# Patient Record
Sex: Female | Born: 1995
Health system: Southern US, Community
[De-identification: ages and names within clinical notes are randomized; demographics above are authoritative.]

## PROBLEM LIST (undated history)

## (undated) DIAGNOSIS — E669 Obesity, unspecified: Secondary | ICD-10-CM

---

## 2020-02-29 ENCOUNTER — Encounter (HOSPITAL_BASED_OUTPATIENT_CLINIC_OR_DEPARTMENT_OTHER): Payer: Self-pay | Admitting: *Deleted

## 2020-02-29 ENCOUNTER — Other Ambulatory Visit: Payer: Self-pay

## 2020-02-29 ENCOUNTER — Emergency Department (HOSPITAL_BASED_OUTPATIENT_CLINIC_OR_DEPARTMENT_OTHER)
Admission: EM | Admit: 2020-02-29 | Discharge: 2020-03-01 | Disposition: A | Discharge status: Home / Self Care | Payer: Self-pay | Attending: Emergency Medicine | Admitting: Emergency Medicine

## 2020-02-29 DIAGNOSIS — N760 Acute vaginitis: Secondary | ICD-10-CM | POA: Insufficient documentation

## 2020-02-29 DIAGNOSIS — B9689 Other specified bacterial agents as the cause of diseases classified elsewhere: Secondary | ICD-10-CM

## 2020-02-29 DIAGNOSIS — R102 Pelvic and perineal pain: Secondary | ICD-10-CM

## 2020-02-29 DIAGNOSIS — Z711 Person with feared health complaint in whom no diagnosis is made: Secondary | ICD-10-CM

## 2020-02-29 DIAGNOSIS — Z79899 Other long term (current) drug therapy: Secondary | ICD-10-CM | POA: Insufficient documentation

## 2020-02-29 LAB — URINALYSIS, ROUTINE W REFLEX MICROSCOPIC
Bilirubin Urine: NEGATIVE
Glucose, UA: NEGATIVE mg/dL
Ketones, ur: NEGATIVE mg/dL
Leukocytes,Ua: NEGATIVE
Nitrite: NEGATIVE
Protein, ur: NEGATIVE mg/dL
Specific Gravity, Urine: 1.025 (ref 1.005–1.030)
pH: 6 (ref 5.0–8.0)

## 2020-02-29 LAB — WET PREP, GENITAL
Sperm: NONE SEEN
Trich, Wet Prep: NONE SEEN
Yeast Wet Prep HPF POC: NONE SEEN

## 2020-02-29 LAB — URINALYSIS, MICROSCOPIC (REFLEX)

## 2020-02-29 LAB — PREGNANCY, URINE: Preg Test, Ur: NEGATIVE

## 2020-02-29 MED ORDER — METRONIDAZOLE 500 MG PO TABS: 500.0000 mg | ORAL_TABLET | Freq: Two times a day (BID) | ORAL | 0 refills | Status: AC

## 2020-02-29 NOTE — ED Provider Notes (Signed)
[facility] Provider Note: Carrie Dell, MD, FACEP  CSN: [id number] MRN: [id number] ARRIVAL: 02/29/20 at 1810 ROOM: [facility]02/[facility]02   CHIEF COMPLAINT  Vaginal Pain   HISTORY OF PRESENT ILLNESS  02/29/20 11:10 PM Carrie Deleon is a 24 y.o. female with 1 to 2 weeks pelvic discomfort.  She describes it as pain with urination but not at rest.  Symptoms are moderate.  She is concerned she may have a urinary tract infection.  She feels like something is "just not right down there".  She started spotting today and that concerned her because she is not due to start her period for about a week and a half.  She has had associated nausea.  She is also had some vaguely described bilateral lower rib pain that is not severe.  It is not worse with palpation or breathing.  She has not noted a vaginal discharge but has noted an abnormal odor.   History reviewed. No pertinent past medical history.  History reviewed. No pertinent surgical history.  No family history on file.  Social History   Tobacco Use  . Smoking status: Not on file  Substance Use Topics  . Alcohol use: Not on file  . Drug use: Not on file    Prior to Admission medications   Medication Sig Start Date End Date Taking? Authorizing Provider  metroNIDAZOLE (FLAGYL) 500 MG tablet Take 1 tablet (500 mg total) by mouth 2 (two) times daily. One po bid x 7 days 02/29/20   Silvano Garofano, Jonny Ruiz, MD    Allergies Patient has no known allergies.   REVIEW OF SYSTEMS  Negative except as noted here or in the History of Present Illness.   PHYSICAL EXAMINATION  Initial Vital Signs Blood pressure 112/71, pulse 88, temperature 98.5 F (36.9 C), temperature source Oral, resp. rate 20, height 5' 4.5" (1.638 m), weight 131.5 kg, last menstrual period 02/29/2020, SpO2 100 %.  Examination General: Well-developed, obese female in no acute distress; appearance consistent with age of record HENT: normocephalic; atraumatic Eyes: pupils equal,  round and reactive to light; extraocular muscles intact Neck: supple Heart: regular rate and rhythm Lungs: clear to auscultation bilaterally Chest: No rib tenderness Abdomen: soft; nondistended; mild suprapubic tenderness; bowel sounds present GU: Normal external genitalia; vaginal spotting; mucoid discharge per cervical os; cervix erythematous; cervical motion tenderness and bilateral adnexal tenderness which are mild to moderate Extremities: No deformity; full range of motion; pulses normal Neurologic: Awake, alert and oriented; motor function intact in all extremities and symmetric; no facial droop Skin: Warm and dry Psychiatric: Normal mood and affect   RESULTS  Summary of this visit's results, reviewed and interpreted by myself:   EKG Interpretation  Date/Time:    Ventricular Rate:    PR Interval:    QRS Duration:   QT Interval:    QTC Calculation:   R Axis:     Text Interpretation:        Laboratory Studies: Results for orders placed or performed during the hospital encounter of 02/29/20 (from the past 24 hour(s))  Urinalysis, Routine w reflex microscopic Urine, Clean Catch     Status: Abnormal   Collection Time: 02/29/20  6:37 PM  Result Value Ref Range   Color, Urine YELLOW YELLOW   APPearance CLEAR CLEAR   Specific Gravity, Urine 1.025 1.005 - 1.030   pH 6.0 5.0 - 8.0   Glucose, UA NEGATIVE NEGATIVE mg/dL   Hgb urine dipstick TRACE (A) NEGATIVE   Bilirubin Urine NEGATIVE NEGATIVE  Ketones, ur NEGATIVE NEGATIVE mg/dL   Protein, ur NEGATIVE NEGATIVE mg/dL   Nitrite NEGATIVE NEGATIVE   Leukocytes,Ua NEGATIVE NEGATIVE  Pregnancy, urine     Status: None   Collection Time: 02/29/20  6:37 PM  Result Value Ref Range   Preg Test, Ur NEGATIVE NEGATIVE  Urinalysis, Microscopic (reflex)     Status: Abnormal   Collection Time: 02/29/20  6:37 PM  Result Value Ref Range   RBC / HPF 0-5 0 - 5 RBC/hpf   WBC, UA 0-5 0 - 5 WBC/hpf   Bacteria, UA MANY (A) NONE SEEN    Squamous Epithelial / LPF 6-10 0 - 5  Wet prep, genital     Status: Abnormal   Collection Time: 02/29/20 11:20 PM   Specimen: Cervical / Endocervical swab  Result Value Ref Range   Yeast Wet Prep HPF POC NONE SEEN NONE SEEN   Trich, Wet Prep NONE SEEN NONE SEEN   Clue Cells Wet Prep HPF POC PRESENT (A) NONE SEEN   WBC, Wet Prep HPF POC MODERATE (A) NONE SEEN   Sperm NONE SEEN    Imaging Studies: No results found.  ED COURSE and MDM  Nursing notes, initial and subsequent vitals signs, including pulse oximetry, reviewed and interpreted by myself.  Vitals:   02/29/20 1834 02/29/20 2139  BP: 127/86 112/71  Pulse: (!) 102 88  Resp: 18 20  Temp: 98.5 F (36.9 C)   TempSrc: Oral   SpO2: 99% 100%  Weight: 131.5 kg   Height: 5' 4.5" (1.638 m)    Medications - No data to display  Wet prep is consistent with bacterial vaginosis.  Her examination is concerning for cervicitis but she states that she and her fianc are mutually monogamous and would prefer to wait until STD testing results before we treat her.  She agrees to be treated for bacterial vaginosis in the meantime and would like to be set up for a pelvic ultrasound.  PROCEDURES  Procedures   ED DIAGNOSES     ICD-10-CM   1. BV (bacterial vaginosis)  N76.0    B96.89   2. Concern about STD in female without diagnosis  Z71.1   3. Pelvic pain in female  R10.2        Paula Libra, MD 02/29/20 2356

## 2020-02-29 NOTE — ED Triage Notes (Signed)
Pt c/o vaginal pain with dysuria  x 3 days

## 2020-03-01 ENCOUNTER — Ambulatory Visit (HOSPITAL_BASED_OUTPATIENT_CLINIC_OR_DEPARTMENT_OTHER)
Admission: RE | Admit: 2020-03-01 | Discharge: 2020-03-01 | Disposition: A | Discharge status: Home / Self Care | Payer: Self-pay | Source: Ambulatory Visit | Attending: Emergency Medicine | Admitting: Emergency Medicine

## 2020-03-01 DIAGNOSIS — R102 Pelvic and perineal pain: Secondary | ICD-10-CM | POA: Insufficient documentation

## 2020-03-01 NOTE — ED Notes (Signed)
Pt to have out patient [place] 8/28 at 10 am. Pt given location and prep instructions.

## 2020-03-02 LAB — URINE CULTURE

## 2020-03-03 LAB — GC/CHLAMYDIA PROBE AMP (~~LOC~~) NOT AT ARMC
Chlamydia: NEGATIVE
Comment: NEGATIVE
Comment: NORMAL
Neisseria Gonorrhea: NEGATIVE

## 2020-03-04 ENCOUNTER — Encounter (HOSPITAL_BASED_OUTPATIENT_CLINIC_OR_DEPARTMENT_OTHER): Payer: Self-pay | Admitting: Emergency Medicine

## 2020-03-04 ENCOUNTER — Other Ambulatory Visit: Payer: Self-pay

## 2020-03-04 ENCOUNTER — Emergency Department (HOSPITAL_BASED_OUTPATIENT_CLINIC_OR_DEPARTMENT_OTHER): Payer: Self-pay

## 2020-03-04 ENCOUNTER — Emergency Department (HOSPITAL_BASED_OUTPATIENT_CLINIC_OR_DEPARTMENT_OTHER)
Admission: EM | Admit: 2020-03-04 | Discharge: 2020-03-04 | Disposition: A | Discharge status: Home / Self Care | Payer: Self-pay | Attending: Emergency Medicine | Admitting: Emergency Medicine

## 2020-03-04 DIAGNOSIS — M25572 Pain in left ankle and joints of left foot: Secondary | ICD-10-CM | POA: Insufficient documentation

## 2020-03-04 DIAGNOSIS — R030 Elevated blood-pressure reading, without diagnosis of hypertension: Secondary | ICD-10-CM

## 2020-03-04 DIAGNOSIS — M545 Low back pain, unspecified: Secondary | ICD-10-CM

## 2020-03-04 DIAGNOSIS — M25561 Pain in right knee: Secondary | ICD-10-CM | POA: Insufficient documentation

## 2020-03-04 DIAGNOSIS — W19XXXA Unspecified fall, initial encounter: Secondary | ICD-10-CM

## 2020-03-04 HISTORY — DX: Obesity, unspecified: E66.9

## 2020-03-04 LAB — PREGNANCY, URINE: Preg Test, Ur: NEGATIVE

## 2020-03-04 MED ORDER — METHOCARBAMOL 500 MG PO TABS: 500.0000 mg | ORAL_TABLET | Freq: Two times a day (BID) | ORAL | 0 refills | Status: AC

## 2020-03-04 MED FILL — METHOCARBAMOL 500 MG TABS: 500 | 5 days supply | Qty: 10 | Fill #0

## 2020-03-04 NOTE — ED Notes (Signed)
Patient transported to X-ray 

## 2020-03-04 NOTE — ED Provider Notes (Signed)
[facility] EMERGENCY DEPARTMENT Provider Note   CSN: [id number] Arrival date & time: 03/04/20  0354     History Chief Complaint  Patient presents with  . Fall    Carrie Deleon is a 24 y.o. female history of obesity otherwise healthy.  Patient presents today after a fall at [facility].  She slipped on a wet floor, she reports her left foot slid out from under her.  She reports she twisted her left ankle, landing on her left ankle and right knee at the same time.  Patient reports the left ankle pain as moderate intensity constant aching worse with palpation and movement improved with rest and elevation.  Patient also reports right knee pain as moderate intensity aching nonradiating worse with movement and palpation improved with rest.  Patient reports that several minutes after her fall she developed right lower back pain, aching mild constant nonradiating worsened with movement improved with rest.  Patient denies head injury, loss conscious, headache, blood thinner use, neck pain, chest pain, abdominal pain, pelvic pain, numbness/weakness, saddle area paresthesias, tingling, bowel/bladder incontinence, urinary retention or any additional concerns.  HPI     Past Medical History:  Diagnosis Date  . Obesity     There are no problems to display for this patient.   History reviewed. No pertinent surgical history.   OB History   No obstetric history on file.     No family history on file.  Social History   Tobacco Use  . Smoking status: Not on file  Substance Use Topics  . Alcohol use: Not on file  . Drug use: Not on file    Home Medications Prior to Admission medications   Medication Sig Start Date End Date Taking? Authorizing Provider  methocarbamol (ROBAXIN) 500 MG tablet Take 1 tablet (500 mg total) by mouth 2 (two) times daily for 5 days. 03/04/20 03/09/20  Harlene Salts A, PA-C  metroNIDAZOLE (FLAGYL) 500 MG tablet Take 1 tablet (500 mg total) by mouth 2 (two)  times daily. One po bid x 7 days 02/29/20   Molpus, John, MD    Allergies    Influenza vac split quad  Review of Systems   Review of Systems Ten systems are reviewed and are negative for acute change except as noted in the HPI  Physical Exam Updated Vital Signs BP (!) 149/106 (BP Location: Right Arm)   Pulse 93   Temp 97.7 F (36.5 C) (Oral)   Resp 19   LMP 02/29/2020   SpO2 100%   Physical Exam Constitutional:      General: She is not in acute distress.    Appearance: Normal appearance. She is well-developed. She is not ill-appearing or diaphoretic.  HENT:     Head: Normocephalic and atraumatic.     Jaw: There is normal jaw occlusion.     Right Ear: External ear normal. No hemotympanum.     Left Ear: External ear normal. No hemotympanum.     Nose: Nose normal.     Mouth/Throat:     Mouth: Mucous membranes are moist.     Pharynx: Oropharynx is clear.  Eyes:     General: Vision grossly intact. Gaze aligned appropriately.     Extraocular Movements: Extraocular movements intact.     Pupils: Pupils are equal, round, and reactive to light.  Neck:     Trachea: Trachea and phonation normal. No tracheal tenderness or tracheal deviation.     Comments: No midline C/T/L spinal tenderness to palpation, no  deformity, crepitus, or step-off noted. No sign of injury to the neck or back. Pulmonary:     Effort: Pulmonary effort is normal. No respiratory distress.  Abdominal:     General: There is no distension.     Palpations: Abdomen is soft.     Tenderness: There is no abdominal tenderness. There is no guarding or rebound.  Musculoskeletal:        General: Normal range of motion.     Cervical back: Normal and normal range of motion.     Thoracic back: Normal.     Lumbar back: Tenderness present. No deformity or bony tenderness.       Back:     Right hip: No tenderness. Normal range of motion.     Left hip: No tenderness. Normal range of motion.     Right upper leg: No deformity  or tenderness.     Left upper leg: No deformity or tenderness.     Right knee: No deformity. Tenderness present over the medial joint line and lateral joint line.     Left knee: No deformity or bony tenderness. No tenderness.     Right lower leg: No deformity or tenderness.     Left lower leg: No deformity or tenderness.     Right ankle: No deformity. No tenderness.     Right Achilles Tendon: Normal.     Left ankle: No deformity. Tenderness present.     Left Achilles Tendon: Normal.     Right foot: No deformity, tenderness or bony tenderness.     Left foot: No deformity, tenderness or bony tenderness.  Skin:    General: Skin is warm and dry.  Neurological:     Mental Status: She is alert.     GCS: GCS eye subscore is 4. GCS verbal subscore is 5. GCS motor subscore is 6.     Comments: Speech is clear and goal oriented, follows commands Major Cranial nerves without deficit, no facial droop Moves extremities without ataxia, coordination intact  Psychiatric:        Behavior: Behavior normal.     ED Results / Procedures / Treatments   Labs (all labs ordered are listed, but only abnormal results are displayed) Labs Reviewed  PREGNANCY, URINE    EKG None  Radiology DG Lumbar Spine Complete  Result Date: 03/04/2020 CLINICAL DATA:  Fall.  Lumbar spine pain. EXAM: LUMBAR SPINE - COMPLETE 4+ VIEW COMPARISON:  No pertinent prior exams are available for comparison. FINDINGS: Five lumbar vertebrae are presumed for the purposes of this examination. No significant spondylolisthesis. There is no appreciable lumbar compression fracture. At L5-S1, there is mild/moderate disc space narrowing and mild facet arthrosis. IMPRESSION: No appreciable lumbar compression fracture. At L5-S, there is mild/moderate disc space narrowing and mild facet arthrosis. Electronically Signed   By: Jackey LogeKyle  Golden DO   On: 03/04/2020 11:37   DG Ankle Complete Left  Result Date: 03/04/2020 CLINICAL DATA:  Fall. EXAM:  LEFT ANKLE COMPLETE - 3+ VIEW COMPARISON:  No recent. FINDINGS: No acute bony or joint abnormality. No evidence of acute fracture or dislocation. Corticated bony density noted adjacent to the lateral malleolus consistent with old fracture fragment. IMPRESSION: Corticated bony density noted adjacent to the lateral malleolus consistent with old fracture fragment. No acute abnormality identified. Electronically Signed   By: Maisie Fushomas  Register   On: 03/04/2020 11:46   DG Knee Complete 4 Views Right  Result Date: 03/04/2020 CLINICAL DATA:  Fall. EXAM: RIGHT KNEE - COMPLETE 4+  VIEW COMPARISON:  No recent. FINDINGS: No acute bony or joint abnormality. No evidence of fracture. No evidence of dislocation. IMPRESSION: No acute abnormality. Electronically Signed   By: Maisie Fus  Register   On: 03/04/2020 11:48   DG Foot Complete Left  Result Date: 03/04/2020 CLINICAL DATA:  Fall. EXAM: LEFT FOOT - COMPLETE 3+ VIEW COMPARISON:  No prior. FINDINGS: No acute bony or joint abnormality. No evidence of fracture or dislocation. No radiopaque foreign body. IMPRESSION: No acute abnormality. Electronically Signed   By: Maisie Fus  Register   On: 03/04/2020 11:51    Procedures Procedures (including critical care time)  Medications Ordered in ED Medications - No data to display  ED Course  I have reviewed the triage vital signs and the nursing notes.  Pertinent labs & imaging results that were available during my care of the patient were reviewed by me and considered in my medical decision making (see chart for details).    MDM Rules/Calculators/A&P                         Additional history obtained from: 1. Nursing notes from this visit. ---------------------------------------------- DG Lumbar Spine:  IMPRESSION:  No appreciable lumbar compression fracture.    At L5-S, there is mild/moderate disc space narrowing and mild facet  arthrosis.   DG Right Knee:  IMPRESSION:  No acute abnormality.   DG Left  Ankle:  IMPRESSION:  Corticated bony density noted adjacent to the lateral malleolus  consistent with old fracture fragment. No acute abnormality  identified.   DG Left Foot:  IMPRESSION:  No acute abnormality.  - No evidence of significant head neck back chest or abdominal injury.  No pain of the pelvis.  Good range of motion and strength of the bilateral upper extremities without pain.  Patient with likely muscular right lower back pain without sciatica, no symptoms to suggest cauda equina or other emergent pathologies.  Will treat with Robaxin 500 mg twice daily.  We discussed muscle relaxer precautions and patient stated understanding.  Patient's patient with likely musculoskeletal right knee pain, placed in a knee brace, given crutches and referred to orthopedist for further evaluation.  Neurovascular intact distally to the right knee, no evidence of septic arthritis, compartment syndrome, DVT, cellulitis or other emergent pathologies.  Additionally no pain to the tibial plateau.  Additionally patient with left ankle pain suspect a ligamentous sprain, neurovascular intact, no evidence of cellulitis, DVT, compartment syndrome, neurovascular compromise, cellulitis or other emergent pathologies.  Patient given ankle brace.  Patient encouraged to follow-up with orthopedist for further evaluation she states understanding of limitations of x-rays today and that further evaluation may be needed.  Patient also informed on incidental findings above and states understanding.  Finally patient with elevated blood pressure reading, patient made aware and encouraged to follow-up with PCP for blood pressure recheck and medication management as indicated.  She is asymptomatic regarding elevated blood pressure reading, patient given signs/symptoms of hypertensive urgency/emergency and to return to the ER for repair.  At this time there does not appear to be any evidence of an acute emergency medical condition and  the patient appears stable for discharge with appropriate outpatient follow up. Diagnosis was discussed with patient who verbalizes understanding of care plan and is agreeable to discharge. I have discussed return precautions with patient who verbalizes understanding. Patient encouraged to follow-up with their PCP and ortho. All questions answered.  Note: Portions of this report may have been transcribed  using voice recognition software. Every effort was made to ensure accuracy; however, inadvertent computerized transcription errors may still be present. Final Clinical Impression(s) / ED Diagnoses Final diagnoses:  Fall, initial encounter  Acute right-sided low back pain without sciatica  Acute pain of right knee  Acute left ankle pain  Elevated blood pressure reading    Rx / DC Orders ED Discharge Orders         Ordered    methocarbamol (ROBAXIN) 500 MG tablet  2 times daily        03/04/20 1414           Elizabeth Palau 03/04/20 1426    Melene Plan, DO 03/04/20 1432

## 2020-03-04 NOTE — Discharge Instructions (Addendum)
At this time there does not appear to be the presence of an emergent medical condition, however there is always the potential for conditions to change. Please read and follow the below instructions.  Please return to the Emergency Department immediately for any new or worsening symptoms. Please be sure to follow up with your Primary Care Provider within one week regarding your visit today; please call their office to schedule an appointment even if you are feeling better for a follow-up visit. You may use the muscle relaxer Robaxin as prescribed to help with your symptoms.  Do not drive or operate heavy machinery while taking Robaxin as it will make you drowsy.  Do not drink alcohol or take other sedating medications while taking Robaxin as this will worsen side effects. The x-ray of your left ankle showed what appears to be an old fracture.  Your x-ray of your lumbar spine shows disc space narrowing and arthrosis.  As we discussed unseen injuries may be present such as unseen fractures or ligamentous and tendon injury among other abnormalities.  I advised that you call your primary care doctor to schedule a follow-up appointment to discuss your work-up and x-ray findings.  You have also been given referral to Dr. Aundria Rud, orthopedic specialist, call his office to schedule a follow-up appointment for further evaluation and treatment of your symptoms.  Please use rest ice and elevation to help with your symptoms. Have your blood pressure rechecked by your primary care doctor at your follow-up visit.  Get help right away if: You develop new bowel or bladder control problems. You have unusual weakness or numbness in your arms or legs. You develop nausea or vomiting. You develop abdominal pain. You feel faint. You cannot move the joint. Your fingers or toes tingle, lose feeling, or get cold and blue. You have a fever along with a joint that is red, warm, and swollen. You have any new/concerning or  worsening of symptoms   Please read the additional information packets attached to your discharge summary.  Do not take your medicine if  develop an itchy rash, swelling in your mouth or lips, or difficulty breathing; call 911 and seek immediate emergency medical attention if this occurs.  You may review your lab tests and imaging results in their entirety on your MyChart account.  Please discuss all results of fully with your primary care provider and other specialist at your follow-up visit.  Note: Portions of this text may have been transcribed using voice recognition software. Every effort was made to ensure accuracy; however, inadvertent computerized transcription errors may still be present.

## 2020-03-04 NOTE — ED Triage Notes (Signed)
Walking in [place] this am,  stepped into a puddle on floor, no LOC, left ankle, right knee and lower back

## 2020-04-21 ENCOUNTER — Other Ambulatory Visit: Payer: Self-pay

## 2020-04-21 DIAGNOSIS — Z20822 Contact with and (suspected) exposure to covid-19: Secondary | ICD-10-CM

## 2020-04-22 LAB — NOVEL CORONAVIRUS, NAA: SARS-CoV-2, NAA: NOT DETECTED

## 2020-04-22 LAB — SARS-COV-2, NAA 2 DAY TAT

## 2020-05-03 ENCOUNTER — Emergency Department (HOSPITAL_BASED_OUTPATIENT_CLINIC_OR_DEPARTMENT_OTHER): Payer: Self-pay

## 2020-05-03 ENCOUNTER — Emergency Department (HOSPITAL_BASED_OUTPATIENT_CLINIC_OR_DEPARTMENT_OTHER)
Admission: EM | Admit: 2020-05-03 | Discharge: 2020-05-03 | Disposition: A | Discharge status: Home / Self Care | Payer: Self-pay | Attending: Emergency Medicine | Admitting: Emergency Medicine

## 2020-05-03 ENCOUNTER — Encounter (HOSPITAL_BASED_OUTPATIENT_CLINIC_OR_DEPARTMENT_OTHER): Payer: Self-pay | Admitting: Emergency Medicine

## 2020-05-03 ENCOUNTER — Other Ambulatory Visit: Payer: Self-pay

## 2020-05-03 DIAGNOSIS — G43109 Migraine with aura, not intractable, without status migrainosus: Secondary | ICD-10-CM

## 2020-05-03 DIAGNOSIS — G43809 Other migraine, not intractable, without status migrainosus: Secondary | ICD-10-CM | POA: Insufficient documentation

## 2020-05-03 DIAGNOSIS — R200 Anesthesia of skin: Secondary | ICD-10-CM

## 2020-05-03 DIAGNOSIS — G43409 Hemiplegic migraine, not intractable, without status migrainosus: Secondary | ICD-10-CM

## 2020-05-03 DIAGNOSIS — R519 Headache, unspecified: Secondary | ICD-10-CM

## 2020-05-03 DIAGNOSIS — F17200 Nicotine dependence, unspecified, uncomplicated: Secondary | ICD-10-CM | POA: Insufficient documentation

## 2020-05-03 DIAGNOSIS — H539 Unspecified visual disturbance: Secondary | ICD-10-CM

## 2020-05-03 LAB — URINALYSIS, ROUTINE W REFLEX MICROSCOPIC
Bilirubin Urine: NEGATIVE
Glucose, UA: NEGATIVE mg/dL
Ketones, ur: NEGATIVE mg/dL
Nitrite: NEGATIVE
Protein, ur: NEGATIVE mg/dL
Specific Gravity, Urine: 1.01 (ref 1.005–1.030)
pH: 7.5 (ref 5.0–8.0)

## 2020-05-03 LAB — PREGNANCY, URINE: Preg Test, Ur: NEGATIVE

## 2020-05-03 LAB — COMPREHENSIVE METABOLIC PANEL
ALT: 17 U/L (ref 0–44)
AST: 18 U/L (ref 15–41)
Albumin: 3.7 g/dL (ref 3.5–5.0)
Alkaline Phosphatase: 55 U/L (ref 38–126)
Anion gap: 9 (ref 5–15)
BUN: 14 mg/dL (ref 6–20)
CO2: 26 mmol/L (ref 22–32)
Calcium: 8.8 mg/dL — ABNORMAL LOW (ref 8.9–10.3)
Chloride: 105 mmol/L (ref 98–111)
Creatinine, Ser: 0.77 mg/dL (ref 0.44–1.00)
GFR, Estimated: >60 mL/min (ref >60)
Glucose, Bld: 86 mg/dL (ref 70–99)
Potassium: 4.1 mmol/L (ref 3.5–5.1)
Sodium: 140 mmol/L (ref 135–145)
Total Bilirubin: 0.7 mg/dL (ref 0.3–1.2)
Total Protein: 7.2 g/dL (ref 6.5–8.1)

## 2020-05-03 LAB — CBC
HCT: 40.8 % (ref 36.0–46.0)
Hemoglobin: 12.9 g/dL (ref 12.0–15.0)
MCH: 27.3 pg (ref 26.0–34.0)
MCHC: 31.6 g/dL (ref 30.0–36.0)
MCV: 86.4 fL (ref 80.0–100.0)
Platelets: 378 10*3/uL (ref 150–400)
RBC: 4.72 MIL/uL (ref 3.87–5.11)
RDW: 13 % (ref 11.5–15.5)
WBC: 10.5 10*3/uL (ref 4.0–10.5)
nRBC: 0 % (ref 0.0–0.2)

## 2020-05-03 LAB — URINALYSIS, MICROSCOPIC (REFLEX)

## 2020-05-03 LAB — DIFFERENTIAL
Abs Immature Granulocytes: 0.03 10*3/uL (ref 0.00–0.07)
Basophils Absolute: 0.1 10*3/uL (ref 0.0–0.1)
Basophils Relative: 1 %
Eosinophils Absolute: 0.1 10*3/uL (ref 0.0–0.5)
Eosinophils Relative: 1 %
Immature Granulocytes: 0 %
Lymphocytes Relative: 26 %
Lymphs Abs: 2.7 10*3/uL (ref 0.7–4.0)
Monocytes Absolute: 0.8 10*3/uL (ref 0.1–1.0)
Monocytes Relative: 8 %
Neutro Abs: 6.8 10*3/uL (ref 1.7–7.7)
Neutrophils Relative %: 64 %

## 2020-05-03 LAB — RAPID URINE DRUG SCREEN, HOSP PERFORMED
Amphetamines: NOT DETECTED
Barbiturates: NOT DETECTED
Benzodiazepines: NOT DETECTED
Cocaine: NOT DETECTED
Opiates: NOT DETECTED
Tetrahydrocannabinol: NOT DETECTED

## 2020-05-03 LAB — CBG MONITORING, ED: Glucose-Capillary: 85 mg/dL (ref 70–99)

## 2020-05-03 LAB — ETHANOL: Alcohol, Ethyl (B): <10 mg/dL (ref <10)

## 2020-05-03 LAB — APTT: aPTT: 30 seconds (ref 24–36)

## 2020-05-03 LAB — PROTIME-INR
INR: 0.9 (ref 0.8–1.2)
Prothrombin Time: 12.1 seconds (ref 11.4–15.2)

## 2020-05-03 MED ORDER — GADOBUTROL 1 MMOL/ML IV SOLN
10.0000 mL | Freq: Once | INTRAVENOUS | Status: AC | PRN
  Administered 2020-05-03: 10 mL via INTRAVENOUS

## 2020-05-03 MED ORDER — IOHEXOL 350 MG/ML SOLN
100.0000 mL | Freq: Once | INTRAVENOUS | Status: AC | PRN
  Administered 2020-05-03: 100 mL via INTRAVENOUS

## 2020-05-03 MED ORDER — DIPHENHYDRAMINE HCL 50 MG/ML IJ SOLN
25.0000 mg | Freq: Once | INTRAMUSCULAR | Status: AC
  Administered 2020-05-03: 25 mg via INTRAVENOUS
  Filled 2020-05-03: qty 1

## 2020-05-03 MED ORDER — MAGNESIUM SULFATE IN D5W 1-5 GM/100ML-% IV SOLN
1.0000 g | Freq: Once | INTRAVENOUS | Status: AC
  Administered 2020-05-03: 1 g via INTRAVENOUS
  Filled 2020-05-03: qty 100

## 2020-05-03 MED ORDER — ASPIRIN 81 MG PO CHEW
81.0000 mg | CHEWABLE_TABLET | Freq: Once | ORAL | Status: AC
  Administered 2020-05-03: 81 mg via ORAL
  Filled 2020-05-03: qty 1

## 2020-05-03 MED ORDER — SODIUM CHLORIDE 0.9 % IV BOLUS
1000.0000 mL | Freq: Once | INTRAVENOUS | Status: AC
  Administered 2020-05-03: 1000 mL via INTRAVENOUS

## 2020-05-03 MED ORDER — PROCHLORPERAZINE EDISYLATE 10 MG/2ML IJ SOLN
10.0000 mg | Freq: Once | INTRAMUSCULAR | Status: AC
  Administered 2020-05-03: 10 mg via INTRAVENOUS
  Filled 2020-05-03: qty 2

## 2020-05-03 MED ORDER — ONDANSETRON HCL 4 MG/2ML IJ SOLN
4.0000 mg | Freq: Once | INTRAMUSCULAR | Status: AC
  Administered 2020-05-03: 4 mg via INTRAVENOUS
  Filled 2020-05-03: qty 2

## 2020-05-03 MED ORDER — KETOROLAC TROMETHAMINE 15 MG/ML IJ SOLN
15.0000 mg | Freq: Once | INTRAMUSCULAR | Status: AC
  Administered 2020-05-03: 15 mg via INTRAVENOUS
  Filled 2020-05-03: qty 1

## 2020-05-03 NOTE — Consult Note (Signed)
[facility] TELEMEDICINE  CONSULT   Date of service: May 03, 2020 Patient Name: Carrie Deleon MRN:  [id number] DOB:  10-Sep-1995 Requesting Provider: Alvira Monday Location of the provider: [facility] [address] Neurohospitalist office Location of the patient: [facility] ED Reason for consult: "Stroke code"   This consult was provided via telemedicine with 2-way video and audio communication. The patient/family was informed that care would be provided in this way and agreed to receive care in this manner.    History of Present Illness  Carrie Deleon is a 24 y.o. female with PMH significant for Obesity, Migraines who presents with headache with pressure and thrombbing behind her eyes with left sided numbness and weakness and blurred vision on the left with squiggly lines. Symptoms started when she was driving to get her boyfriend.  Also endorses that she had a fall last week while she was up on the ladder.  She is not on birth control, she just finished her last menstrual period today.  She feels that her symptoms are improving but she does have persistent left facial, left arm and left leg numbness but no weakness. She is able to count fingers in the periphery of her vision. Her headache started off as pulsating but is now a 7-8/10 headache behind her eyes and in the back of her head.  NIHSS components Score: Comment  1a Level of Conscious 0[x]  1[]  2[]  3[]      1b LOC Questions 0[x]  1[]  2[]       1c LOC Commands 0[x]  1[]  2[]       2 Best Gaze 0[x]  1[]  2[]       3 Visual 0[x]  1[]  2[]  3[]      4 Facial Palsy 0[x]  1[]  2[]  3[]      5a Motor Arm - left 0[x]  1[]  2[]  3[]  4[]  UN[]    5b Motor Arm - Right 0[x]  1[]  2[]  3[]  4[]  UN[]    6a Motor Leg - Left 0[x]  1[]  2[]  3[]  4[]  UN[]    6b Motor Leg - Right 0[x]  1[]  2[]  3[]  4[]  UN[]    7 Limb Ataxia 0[x]  1[]  2[]  3[]  UN[]     8 Sensory 0[]  1[x]  2[]  UN[]      9 Best Language 0[x]  1[]  2[]  3[]      10 Dysarthria 0[x]  1[]  2[]  UN[]      11 Extinct. and  Inattention 0[x]  1[]  2[]       TOTAL: 1       Stroke Measures   Last Known Well: 0900 TPA Given: no, suspicion for stroke was low given her headache with migrainous features, squiggly lines in vision with negative CTH and CTA. IR Thrombectomy: no LVO mRS: Modified Rankin Scale: 0-Completely asymptomatic and back to baseline post- stroke Time of teleneurologist evaluation: 1200.   Vitals   Vitals:   05/03/20 1136 05/03/20 1145 05/03/20 1159 05/03/20 1200  BP:  124/75  118/65  Pulse: 80 86 78 79  Resp:   19 20  Temp:      TempSrc:      SpO2: 98% 99% 99% 99%  Weight:      Height:         Body mass index is 49.78 kg/m.   Tele-neuro Exam and NIHSS   General: awake, alert, provides history  1A: Level of Consciousness - 0 1B: Ask Month and Age - 0 1C: 'Blink Eyes' & 'Squeeze Hands' - 0 2: Test Horizontal Extraocular Movements - 0 3: Test Visual Fields - 0 4: Test Facial Palsy - 0 5A: Test Left Arm  Motor Drift - 0 5B: Test Right Arm Motor Drift - 0 6A: Test Left Leg Motor Drift - 0 6B: Test Right Leg Motor Drift - 0 7: Test Limb Ataxia - 0 8: Test Sensation - 1 9: Test Language/Aphasia- 0 10: Test Dysarthria - 0 11: Test Extinction/Inattention - 0 NIHSS score: 1  Other exam findings: none  Imaging and Labs   CBC:  Recent Labs  Lab 05/03/20 1119  WBC 10.5  NEUTROABS 6.8  HGB 12.9  HCT 40.8  MCV 86.4  PLT 378    Basic Metabolic Panel:  Lab Results  Component Value Date   NA 140 05/03/2020   K 4.1 05/03/2020   CO2 26 05/03/2020   GLUCOSE 86 05/03/2020   BUN 14 05/03/2020   CREATININE 0.77 05/03/2020   CALCIUM 8.8 (L) 05/03/2020   GFRNONAA >60 05/03/2020   Lipid Panel: No results found for: LDLCALC HgbA1c: No results found for: HGBA1C Urine Drug Screen: No results found for: LABOPIA, COCAINSCRNUR, LABBENZ, AMPHETMU, THCU, LABBARB  Alcohol Level No results found for: [facility]   CTH was negative for a large hypodensity concerning for a large  territory infarct or hyperdensity concerning for an ICH  CTA: No LVO  Impression   Carrie Deleon is a 24 y.o. female with PMH significant for obesity, Migraines who presents with pulsatile throbbing headache behind her eye. Her limited tele-neurologic examination is notable for left sided numbness. We discussed her presentation including my primary suspicion for this being a migraine headache specially with the visual aura, migrainous features to her headache and rapidly improving deficit with resolution of the visual field cut and now able to count fingers in the peripheral vision. She does have persistent numbness but I personally feel that with no weakness and normal finger to nose and heel to shin, numbness would to a mild stroke symptom.  We will get an MRI Brain and a CT Head and either MR Venogram or CT Venogram.  Recommendations  - Migraine cocktail with Magnesium, toradol, Aspirin and Benadryl, IV Fluids - I orderer MRI Brain without contrast - I ordered MR Venogram but she can also get CT Venogram to rule out dural sinus venous thrombosis. Images of the venous system on CT Angio do not demonstrate and sinus venous thrombosis thou. ______________________________________________________________________    This patient is receiving care for possible acute neurological changes. There was 60 minutes of care by this provider at the time of service, including time for direct evaluation via telemedicine, review of medical records, imaging studies and discussion of findings with providers, the patient and/or family.  Erick Blinks [facility] Pager Number [phone]  If 7pm- 7am, please page neurology on call as listed in [facility].

## 2020-05-03 NOTE — ED Notes (Signed)
As I write this, she is in CT. She is awake, alert and oriented x 4 with clear speech.

## 2020-05-03 NOTE — ED Notes (Signed)
As I write this, she is in MRI. 

## 2020-05-03 NOTE — ED Notes (Signed)
Tele stroke speaking with patient about plan of care

## 2020-05-03 NOTE — ED Notes (Signed)
Patient transported to CT 

## 2020-05-03 NOTE — ED Triage Notes (Addendum)
Pt reports that fell out of a chair and hit her head on a table 1 week ago. Pt reports had a headache since.  Today, about 9:30 am, while driving she began to experience vision disturbance described as if she was staring into the sun for a long period of time.  Pt reports that on the peripheral side she noticed squiggly lines.  Pt with minor weakness noted on left side.  Pt with history of migraines but this headache is worse.

## 2020-05-03 NOTE — ED Notes (Signed)
CODE STROKE cart at bedisde - Insurance underwriter on stroke cart

## 2020-05-03 NOTE — ED Notes (Signed)
ED Provider at bedside. 

## 2020-05-03 NOTE — ED Provider Notes (Signed)
[facility] EMERGENCY DEPARTMENT Provider Note   CSN: [id number] Arrival date & time: 05/03/20  1029  An emergency department physician performed an initial assessment on this suspected stroke patient at 1112.  History Chief Complaint  Patient presents with  . Headache  . Blurred Vision    Carrie Deleon is a 24 y.o. female.  HPI      24 year old female with history of migraines presents with concern for headache, left-sided visual changes, numbness and weakness on the left.  Reports she was last normal at 9 AM, noted at 930 as she was driving she began to have the visual disturbance where she had loss of vision on the left side of both her left and right eye.  Reports it did appear to be squiggly lines that were also present in the center of her vision.  Felt numbness on the left side.  Has a dull posterior headache, but is not as severe as her prior migraines.  Reports some associated nausea.  Denies any difficulty talking.  Reports feeling off balance.  Has never had symptoms like this before.  Reports she does occasionally smoke cigarettes and drink alcohol but denies other drug use.  Denies any other medical history including no history of diabetes, hypertension or hyperlipidemia although notes family history of these.  Past Medical History:  Diagnosis Date  . Obesity     There are no problems to display for this patient.   History reviewed. No pertinent surgical history.   OB History   No obstetric history on file.     No family history on file.  Social History   Tobacco Use  . Smoking status: Current Some Day Smoker  . Smokeless tobacco: Never Used  Vaping Use  . Vaping Use: Every day  Substance Use Topics  . Alcohol use: Yes  . Drug use: Never    Home Medications Prior to Admission medications   Medication Sig Start Date End Date Taking? Authorizing Provider  metroNIDAZOLE (FLAGYL) 500 MG tablet Take 1 tablet (500 mg total) by mouth 2 (two) times  daily. One po bid x 7 days 02/29/20   Molpus, John, MD    Allergies    Influenza vac split quad  Review of Systems   Review of Systems  Constitutional: Negative for fever.  Eyes: Positive for visual disturbance.  Respiratory: Negative for cough and shortness of breath.   Cardiovascular: Negative for chest pain.  Gastrointestinal: Positive for nausea. Negative for abdominal pain.  Genitourinary: Negative for difficulty urinating.  Musculoskeletal: Negative for back pain and neck pain.  Skin: Negative for rash.  Neurological: Positive for weakness, numbness and headaches. Negative for syncope, facial asymmetry and speech difficulty.    Physical Exam Updated Vital Signs BP 113/76   Pulse 65   Temp 98.4 F (36.9 C) (Oral)   Resp 18   Ht 5\' 4"  (1.626 m)   Wt 131.5 kg   LMP 04/26/2020   SpO2 97%   BMI 49.78 kg/m   Physical Exam Vitals and nursing note reviewed.  Constitutional:      General: She is not in acute distress.    Appearance: She is well-developed. She is not diaphoretic.  HENT:     Head: Normocephalic and atraumatic.  Eyes:     General: Visual field deficit (on initial exam left sided) present.     Conjunctiva/sclera: Conjunctivae normal.  Cardiovascular:     Rate and Rhythm: Normal rate and regular rhythm.     Heart  sounds: Normal heart sounds. No murmur heard.  No friction rub. No gallop.   Pulmonary:     Effort: Pulmonary effort is normal. No respiratory distress.     Breath sounds: Normal breath sounds. No wheezing or rales.  Abdominal:     General: There is no distension.     Palpations: Abdomen is soft.     Tenderness: There is no abdominal tenderness. There is no guarding.  Musculoskeletal:        General: No tenderness.     Cervical back: Normal range of motion.  Skin:    General: Skin is warm and dry.     Findings: No erythema or rash.  Neurological:     Mental Status: She is alert and oriented to person, place, and time.     GCS: GCS eye  subscore is 4. GCS verbal subscore is 5. GCS motor subscore is 6.     Cranial Nerves: No dysarthria or facial asymmetry.     Sensory: Sensory deficit (left face, arm and leg) present.     Motor: Pronator drift: mild left sided weakness LUE and LLE.     Coordination: Coordination is intact. Coordination normal.     ED Results / Procedures / Treatments   Labs (all labs ordered are listed, but only abnormal results are displayed) Labs Reviewed  COMPREHENSIVE METABOLIC PANEL - Abnormal; Notable for the following components:      Result Value   Calcium 8.8 (*)    All other components within normal limits  URINALYSIS, ROUTINE W REFLEX MICROSCOPIC - Abnormal; Notable for the following components:   Hgb urine dipstick SMALL (*)    Leukocytes,Ua TRACE (*)    All other components within normal limits  URINALYSIS, MICROSCOPIC (REFLEX) - Abnormal; Notable for the following components:   Bacteria, UA FEW (*)    All other components within normal limits  ETHANOL  PROTIME-INR  APTT  CBC  DIFFERENTIAL  RAPID URINE DRUG SCREEN, HOSP PERFORMED  PREGNANCY, URINE  CBG MONITORING, ED    EKG None  Radiology CT Angio Head W or Wo Contrast  Result Date: 05/03/2020 CLINICAL DATA:  Headache, visual disturbance EXAM: CT HEAD WITHOUT CONTRAST CT ANGIOGRAPHY OF THE HEAD AND NECK TECHNIQUE: Contiguous axial images were obtained from the base of the skull through the vertex without intravenous contrast. Multidetector CT imaging of the head and neck was performed using the standard protocol during bolus administration of intravenous contrast. Multiplanar CT image reconstructions and MIPs were obtained to evaluate the vascular anatomy. Carotid stenosis measurements (when applicable) are obtained utilizing NASCET criteria, using the distal internal carotid diameter as the denominator. CONTRAST:  OMNIPAQUE IOHEXOL 350 MG/ML SOLN COMPARISON:  None. FINDINGS: CT HEAD Brain: There is no acute intracranial  hemorrhage, mass effect, or edema. Gray-white differentiation is preserved. There is no extra-axial fluid collection. Ventricles and sulci are within normal limits in size and configuration. Vascular: No hyperdense vessel or unexpected calcification. Skull: Calvarium is unremarkable. Sinuses/Orbits: No acute finding. Other: None. Review of the MIP images confirms the above findings CTA NECK Aortic arch: Great vessel origins are partially obscured by artifact. Right carotid system: Patent. No measurable stenosis or evidence of dissection. Left carotid system: Patent. No measurable stenosis or evidence of dissection. Vertebral arteries: Patent. Origins are obscured by artifact. Left vertebral artery is dominant. Skeleton: No significant abnormality. Other neck: No mass or adenopathy. Upper chest: Lung apices are clear. Review of the MIP images confirms the above findings CTA HEAD Anterior  circulation: Intracranial internal carotid arteries are patent. Anterior and middle cerebral arteries are patent. Posterior circulation: Intracranial vertebral arteries, basilar artery, and posterior cerebral arteries are patent. Venous sinuses: Patent as allowed by contrast bolus timing. Review of the MIP images confirms the above findings IMPRESSION: There is no acute intracranial hemorrhage or evidence of acute infarction. ASPECT score is 10. No large vessel occlusion, hemodynamically significant stenosis, or evidence of dissection. These results were called by telephone at the time of interpretation on 05/03/2020 at 11:35 am to provider [facility] , who verbally acknowledged these results. Electronically Signed   By: Guadlupe Spanish M.D.   On: 05/03/2020 11:51   CT Angio Neck W and/or Wo Contrast  Result Date: 05/03/2020 CLINICAL DATA:  Headache, visual disturbance EXAM: CT HEAD WITHOUT CONTRAST CT ANGIOGRAPHY OF THE HEAD AND NECK TECHNIQUE: Contiguous axial images were obtained from the base of the skull through the  vertex without intravenous contrast. Multidetector CT imaging of the head and neck was performed using the standard protocol during bolus administration of intravenous contrast. Multiplanar CT image reconstructions and MIPs were obtained to evaluate the vascular anatomy. Carotid stenosis measurements (when applicable) are obtained utilizing NASCET criteria, using the distal internal carotid diameter as the denominator. CONTRAST:  OMNIPAQUE IOHEXOL 350 MG/ML SOLN COMPARISON:  None. FINDINGS: CT HEAD Brain: There is no acute intracranial hemorrhage, mass effect, or edema. Gray-white differentiation is preserved. There is no extra-axial fluid collection. Ventricles and sulci are within normal limits in size and configuration. Vascular: No hyperdense vessel or unexpected calcification. Skull: Calvarium is unremarkable. Sinuses/Orbits: No acute finding. Other: None. Review of the MIP images confirms the above findings CTA NECK Aortic arch: Great vessel origins are partially obscured by artifact. Right carotid system: Patent. No measurable stenosis or evidence of dissection. Left carotid system: Patent. No measurable stenosis or evidence of dissection. Vertebral arteries: Patent. Origins are obscured by artifact. Left vertebral artery is dominant. Skeleton: No significant abnormality. Other neck: No mass or adenopathy. Upper chest: Lung apices are clear. Review of the MIP images confirms the above findings CTA HEAD Anterior circulation: Intracranial internal carotid arteries are patent. Anterior and middle cerebral arteries are patent. Posterior circulation: Intracranial vertebral arteries, basilar artery, and posterior cerebral arteries are patent. Venous sinuses: Patent as allowed by contrast bolus timing. Review of the MIP images confirms the above findings IMPRESSION: There is no acute intracranial hemorrhage or evidence of acute infarction. ASPECT score is 10. No large vessel occlusion, hemodynamically  significant stenosis, or evidence of dissection. These results were called by telephone at the time of interpretation on 05/03/2020 at 11:35 am to provider [facility] , who verbally acknowledged these results. Electronically Signed   By: Guadlupe Spanish M.D.   On: 05/03/2020 11:51   MR BRAIN WO CONTRAST  Result Date: 05/03/2020 CLINICAL DATA:  Dural venous sinus thrombosis suspected. EXAM: MR HEAD WITHOUT CONTRAST MR VENOGRAM HEAD WITHOUT AND WITH CONTRAST TECHNIQUE: Multiplanar, multiecho pulse sequences of the brain and surrounding structures were obtained without intravenous contrast. Angiographic images of the intracranial venous structures were obtained using MRV technique without and with intravenous contrast. CONTRAST:  84mL GADAVIST GADOBUTROL 1 MMOL/ML IV SOLN; <See Chart> GADAVIST GADOBUTROL 1 MMOL/ML IV SOLN COMPARISON:  CT/CT angiogram of the head and May 03, 2020. FINDINGS: MRI brain: Brain: No acute infarction, hemorrhage, hydrocephalus, extra-axial collection or mass lesion. The brain parenchyma has normal morphology and signal characteristics. Vascular: Normal flow voids. Skull and upper cervical spine: Normal marrow signal.  Sinuses/Orbits: Negative. Other: Prominent bilateral palatine tonsils. MRV brain: Superior sagittal sinus, internal cerebral veins, vein of Galen, straight sinus, transverse sinuses, sigmoid sinuses, and jugular bulbs are patent without evidence of thrombus or stenosis. IMPRESSION: 1. No acute intracranial abnormality. 2. Normal MRV of the head. No evidence of dural venous sinus thrombosis. Electronically Signed   By: Baldemar Lenis M.D.   On: 05/03/2020 14:30   MR Venogram Head  Result Date: 05/03/2020 CLINICAL DATA:  Dural venous sinus thrombosis suspected. EXAM: MR HEAD WITHOUT CONTRAST MR VENOGRAM HEAD WITHOUT AND WITH CONTRAST TECHNIQUE: Multiplanar, multiecho pulse sequences of the brain and surrounding structures were obtained without  intravenous contrast. Angiographic images of the intracranial venous structures were obtained using MRV technique without and with intravenous contrast. CONTRAST:  10mL GADAVIST GADOBUTROL 1 MMOL/ML IV SOLN; <See Chart> GADAVIST GADOBUTROL 1 MMOL/ML IV SOLN COMPARISON:  CT/CT angiogram of the head and May 03, 2020. FINDINGS: MRI brain: Brain: No acute infarction, hemorrhage, hydrocephalus, extra-axial collection or mass lesion. The brain parenchyma has normal morphology and signal characteristics. Vascular: Normal flow voids. Skull and upper cervical spine: Normal marrow signal. Sinuses/Orbits: Negative. Other: Prominent bilateral palatine tonsils. MRV brain: Superior sagittal sinus, internal cerebral veins, vein of Galen, straight sinus, transverse sinuses, sigmoid sinuses, and jugular bulbs are patent without evidence of thrombus or stenosis. IMPRESSION: 1. No acute intracranial abnormality. 2. Normal MRV of the head. No evidence of dural venous sinus thrombosis. Electronically Signed   By: Baldemar Lenis M.D.   On: 05/03/2020 14:30   CT HEAD CODE STROKE WO CONTRAST  Result Date: 05/03/2020 CLINICAL DATA:  Headache, visual disturbance EXAM: CT HEAD WITHOUT CONTRAST CT ANGIOGRAPHY OF THE HEAD AND NECK TECHNIQUE: Contiguous axial images were obtained from the base of the skull through the vertex without intravenous contrast. Multidetector CT imaging of the head and neck was performed using the standard protocol during bolus administration of intravenous contrast. Multiplanar CT image reconstructions and MIPs were obtained to evaluate the vascular anatomy. Carotid stenosis measurements (when applicable) are obtained utilizing NASCET criteria, using the distal internal carotid diameter as the denominator. CONTRAST:  OMNIPAQUE IOHEXOL 350 MG/ML SOLN COMPARISON:  None. FINDINGS: CT HEAD Brain: There is no acute intracranial hemorrhage, mass effect, or edema. Gray-white differentiation is  preserved. There is no extra-axial fluid collection. Ventricles and sulci are within normal limits in size and configuration. Vascular: No hyperdense vessel or unexpected calcification. Skull: Calvarium is unremarkable. Sinuses/Orbits: No acute finding. Other: None. Review of the MIP images confirms the above findings CTA NECK Aortic arch: Great vessel origins are partially obscured by artifact. Right carotid system: Patent. No measurable stenosis or evidence of dissection. Left carotid system: Patent. No measurable stenosis or evidence of dissection. Vertebral arteries: Patent. Origins are obscured by artifact. Left vertebral artery is dominant. Skeleton: No significant abnormality. Other neck: No mass or adenopathy. Upper chest: Lung apices are clear. Review of the MIP images confirms the above findings CTA HEAD Anterior circulation: Intracranial internal carotid arteries are patent. Anterior and middle cerebral arteries are patent. Posterior circulation: Intracranial vertebral arteries, basilar artery, and posterior cerebral arteries are patent. Venous sinuses: Patent as allowed by contrast bolus timing. Review of the MIP images confirms the above findings IMPRESSION: There is no acute intracranial hemorrhage or evidence of acute infarction. ASPECT score is 10. No large vessel occlusion, hemodynamically significant stenosis, or evidence of dissection. These results were called by telephone at the time of interpretation on 05/03/2020 at  11:35 am [facility] , who verbally acknowledged these results. Electronically Signed   By: Guadlupe SpanishPraneil  Patel M.D.   On: 05/03/2020 11:51    Procedures .Critical Care Performed by: Alvira MondaySchlossman, Murry Diaz, MD Authorized by: Alvira MondaySchlossman, Diana Armijo, MD   Critical care provider statement:    Critical care time (minutes):  30   Critical care was necessary to treat or prevent imminent or life-threatening deterioration of the following conditions:  CNS failure or compromise    Critical care was time spent personally by me on the following activities:  Development of treatment plan with patient or surrogate, discussions with consultants, examination of patient, ordering and performing treatments and interventions, ordering and review of laboratory studies, ordering and review of radiographic studies and re-evaluation of patient's condition   (including critical care time)  Medications Ordered in ED Medications  iohexol (OMNIPAQUE) 350 MG/ML injection 100 mL (100 mLs Intravenous Contrast Given 05/03/20 1127)  aspirin chewable tablet 81 mg (81 mg Oral Given 05/03/20 1340)  prochlorperazine (COMPAZINE) injection 10 mg (10 mg Intravenous Given 05/03/20 1341)  diphenhydrAMINE (BENADRYL) injection 25 mg (25 mg Intravenous Given 05/03/20 1341)  sodium chloride 0.9 % bolus 1,000 mL (1,000 mLs Intravenous New Bag/Given 05/03/20 1345)  ketorolac (TORADOL) 15 MG/ML injection 15 mg (15 mg Intravenous Given 05/03/20 1341)  magnesium sulfate IVPB 1 g 100 mL (1 g Intravenous New Bag/Given 05/03/20 1354)  ondansetron (ZOFRAN) injection 4 mg (4 mg Intravenous Given 05/03/20 1340)  gadobutrol (GADAVIST) 1 MMOL/ML injection 10 mL ( Intravenous Canceled Entry 05/03/20 1400)  gadobutrol (GADAVIST) 1 MMOL/ML injection 10 mL (10 mLs Intravenous Contrast Given 05/03/20 1403)    ED Course  I have reviewed the triage vital signs and the nursing notes.  Pertinent labs & imaging results that were available during my care of the patient were reviewed by me and considered in my medical decision making (see chart for details).    MDM Rules/Calculators/A&P                          24 year old female with history of migraines presents with concern for headache, left-sided visual changes, numbness and weakness on the left.  Given my initial exam, called a code stroke, and telemetry neurology was brought to bedside.  She had emergent CT head and CTA head and neck which showed no evidence of  acute abnormalities.  On repeat exam, she has resolved visual field deficit, improved weakness, and discussion with TeleNeurology felt tPA risks outweigh benefits. Plan for MRI brain wwo, MR venogram and headache cocktail for possible complicated migraine, particularly given visual changes such as squiggly lines more consistent with this.   MRI/MRV negative.  She feels improved after headache cocktail and denies continuing numbness or visual changes. Recommend outpatient PCP and Neurology follow up. Patient discharged in stable condition with understanding of reasons to return.   Final Clinical Impression(s) / ED Diagnoses Final diagnoses:  Left sided numbness  Acute nonintractable headache, unspecified headache type  Visual changes  Complicated migraine    Rx / DC Orders ED Discharge Orders    None       Alvira MondaySchlossman, Roshonda Sperl, MD 05/03/20 1513

## 2021-07-08 IMAGING — CT CT ANGIO HEAD
1 of 10 series · 3 of 33 positions shown · IV contrast (Omnipaque)
Comparison: None.

CLINICAL DATA: Headache, visual disturbance

EXAM:
CT HEAD WITHOUT CONTRAST
CT ANGIOGRAPHY OF THE HEAD AND NECK
TECHNIQUE: Contiguous axial images were obtained from the base of the skull
through the vertex without intravenous contrast. Multidetector CT
imaging of the head and neck was performed using the standard
protocol during bolus administration of intravenous contrast.
Multiplanar CT image reconstructions and MIPs were obtained to
evaluate the vascular anatomy. Carotid stenosis measurements (when
applicable) are obtained utilizing NASCET criteria, using the distal
internal carotid diameter as the denominator.
CONTRAST:  100mL OMNIPAQUE IOHEXOL 350 MG/ML SOLN

[Series 508: axial thin · axial · 0.48mm/px · z∈[-417,-95]mm · 3 of 323 slices shown]
[im 1/323  soft-tissue]
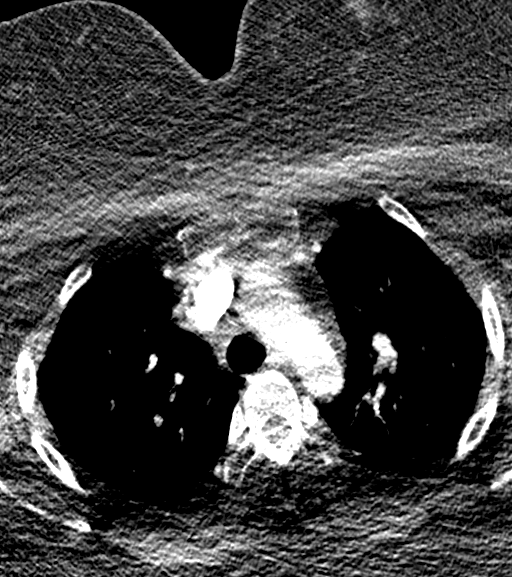
[im 162/323  bone]
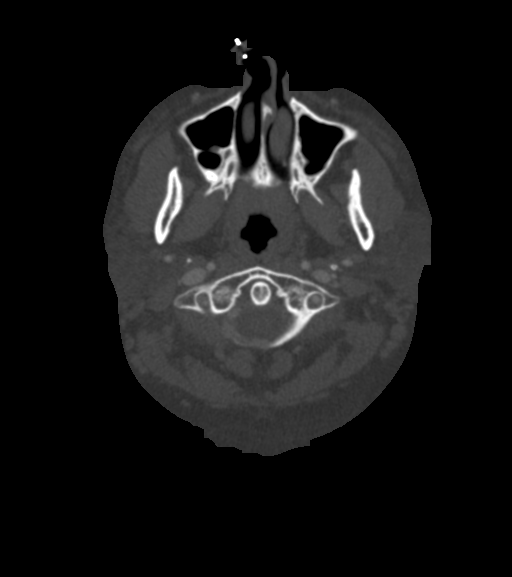
[im 323/323  soft-tissue]
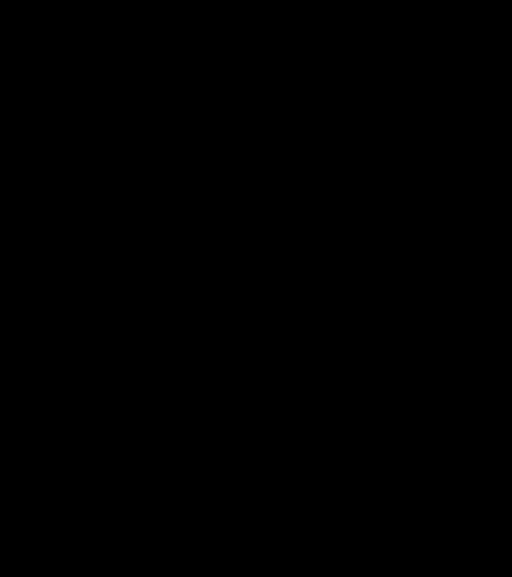

[3 of 33 positions shown; findings below may reference images not displayed]

FINDINGS: CT HEAD

Brain: There is no acute intracranial hemorrhage, mass effect, or
edema. Gray-white differentiation is preserved. There is no
extra-axial fluid collection. Ventricles and sulci are within normal
limits in size and configuration.

Vascular: No hyperdense vessel or unexpected calcification.

Skull: Calvarium is unremarkable.

Sinuses/Orbits: No acute finding.

Other: None.

Review of the MIP images confirms the above findings

CTA NECK

Aortic arch: Great vessel origins are partially obscured by
artifact.

Right carotid system: Patent. No measurable stenosis or evidence of
dissection.

Left carotid system: Patent. No measurable stenosis or evidence of
dissection.

Vertebral arteries: Patent. Origins are obscured by artifact. Left
vertebral artery is dominant.

Skeleton: No significant abnormality.

Other neck: No mass or adenopathy.

Upper chest: Lung apices are clear.

Review of the MIP images confirms the above findings

CTA HEAD

Anterior circulation: Intracranial internal carotid arteries are
patent. Anterior and middle cerebral arteries are patent.

Posterior circulation: Intracranial vertebral arteries, basilar
artery, and posterior cerebral arteries are patent.

Venous sinuses: Patent as allowed by contrast bolus timing.

Review of the MIP images confirms the above findings
IMPRESSION: There is no acute intracranial hemorrhage or evidence of acute
infarction. ASPECT score is 10.

No large vessel occlusion, hemodynamically significant stenosis, or
evidence of dissection.

These results were called by telephone at the time of interpretation
acknowledged these results.

## 2021-07-08 IMAGING — MR MR [PERSON_NAME] HEAD
4 series · 48 of 48 positions shown · IV contrast (GADAVIST)
Comparison: CT/CT angiogram of the head and May 03, 2020.

CLINICAL DATA: Dural venous sinus thrombosis suspected.

EXAM:
MR HEAD WITHOUT CONTRAST
MR VENOGRAM HEAD WITHOUT AND WITH CONTRAST
TECHNIQUE: Multiplanar, multiecho pulse sequences of the brain and surrounding
structures were obtained without intravenous contrast.
Angiographic images of the intracranial venous structures were
obtained using MRV technique without and with intravenous contrast.
CONTRAST:  10mL GADAVIST GADOBUTROL 1 MMOL/ML IV SOLN; <See Chart>
GADAVIST GADOBUTROL 1 MMOL/ML IV SOLN

[Series 3: tof_2d_veins · coronal · 3.0mm · 0.98mm/px · 7 of 86 slices shown]
[im 1/86]
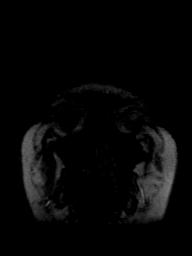
[im 15/86]
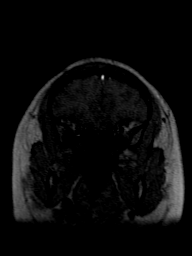
[im 29/86]
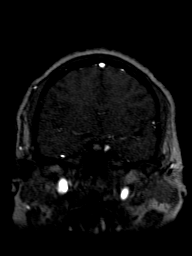
[im 43/86]
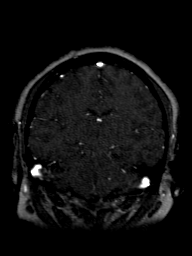
[im 57/86]
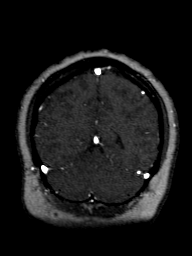
[im 71/86]
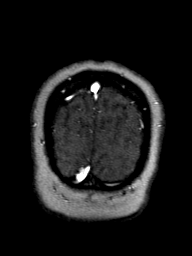
[im 86/86]
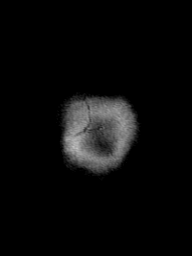

[Series 16: t1_mprage_sag_iso · sagittal · 1.0mm · 0.90mm/px · 13 of 176 slices shown]
[im 1/176]
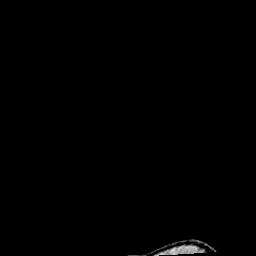
[im 15/176]
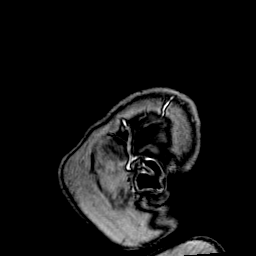
[im 30/176]
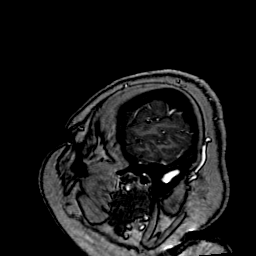
[im 44/176]
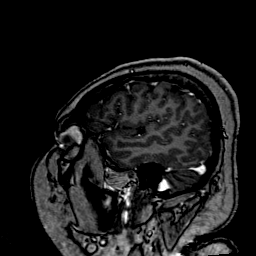
[im 59/176]
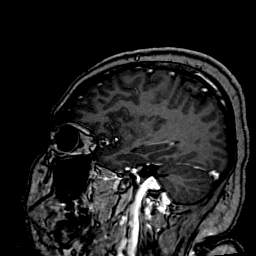
[im 73/176]
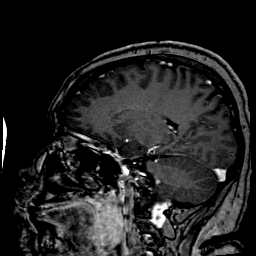
[im 88/176]
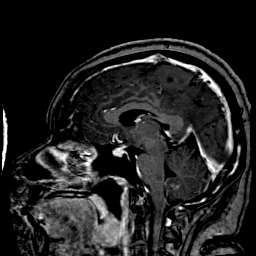
[im 103/176]
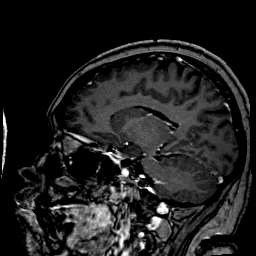
[im 117/176]
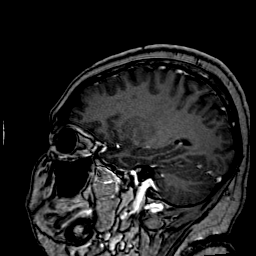
[im 132/176]
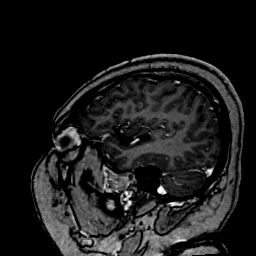
[im 146/176]
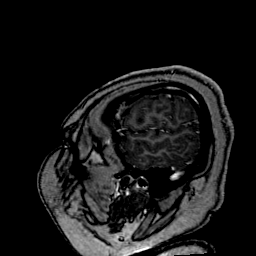
[im 161/176]
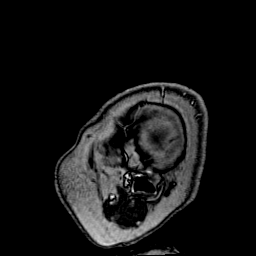
[im 176/176]
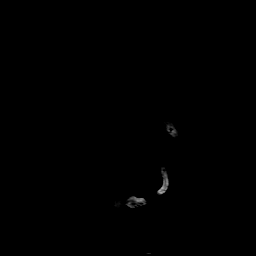

[Series 102: ax brain reformat · axial · 1.0mm · 0.90mm/px · z∈[-83,+104]mm · 13 of 182 slices shown]
[im 1/182]
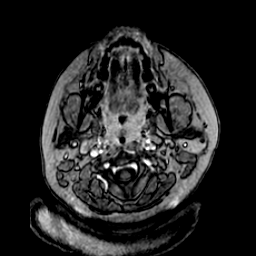
[im 16/182]
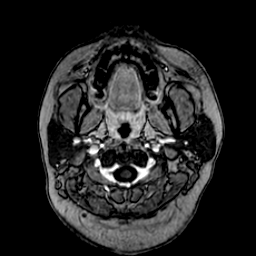
[im 31/182]
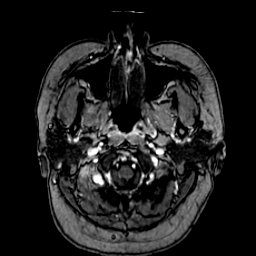
[im 46/182]
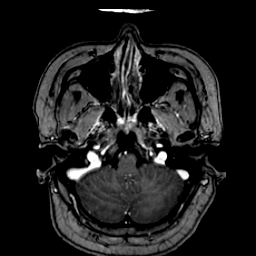
[im 61/182]
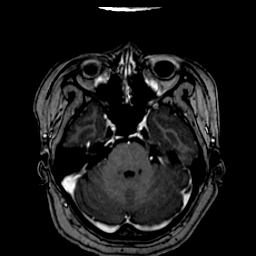
[im 76/182]
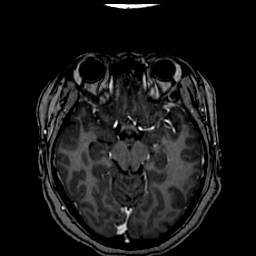
[im 91/182]
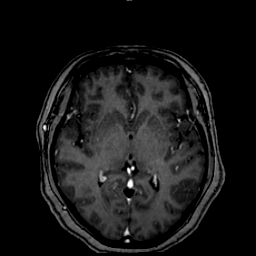
[im 106/182]
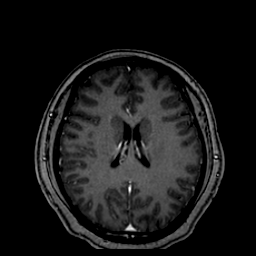
[im 121/182]
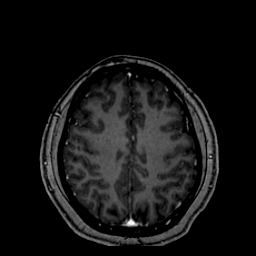
[im 136/182]
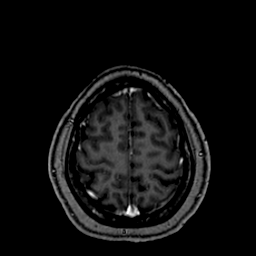
[im 151/182]
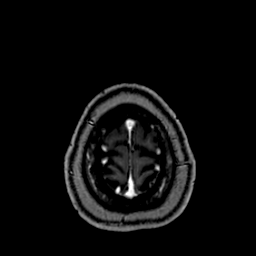
[im 166/182]
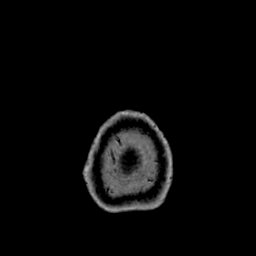
[im 182/182]
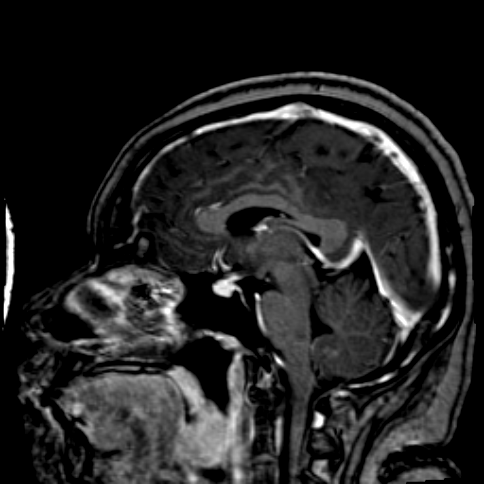

[Series 103: cor brain reformat · coronal · 1.0mm · 0.90mm/px · 15 of 205 slices shown]
[im 1/205]
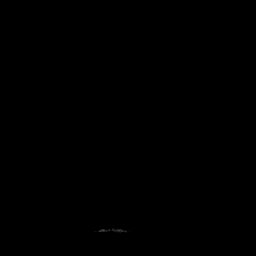
[im 15/205]
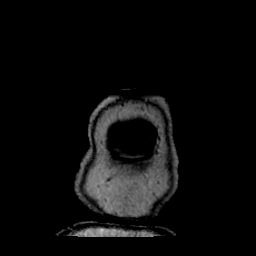
[im 30/205]
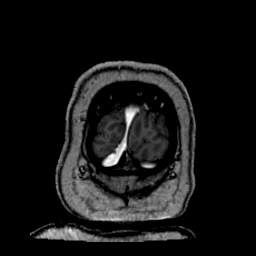
[im 44/205]
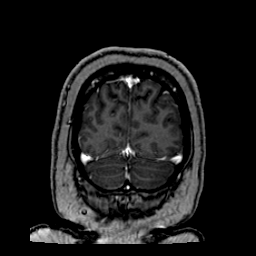
[im 59/205]
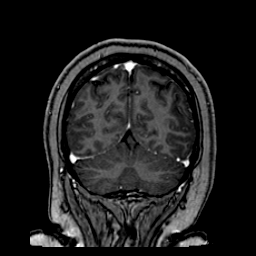
[im 73/205]
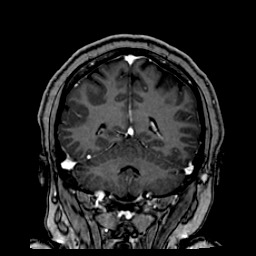
[im 88/205]
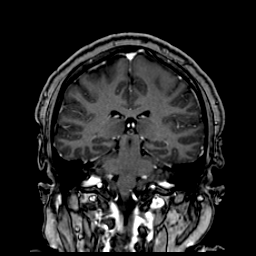
[im 103/205]
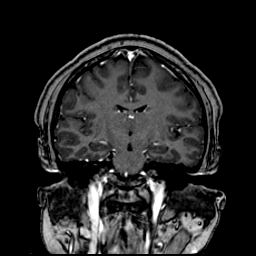
[im 117/205]
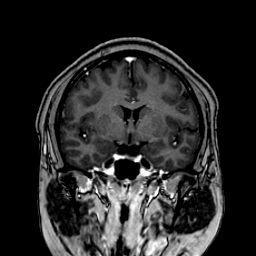
[im 132/205]
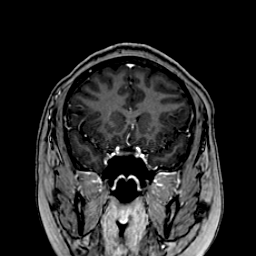
[im 146/205]
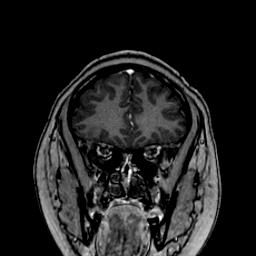
[im 161/205]
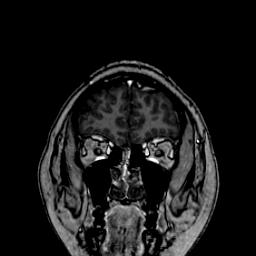
[im 175/205]
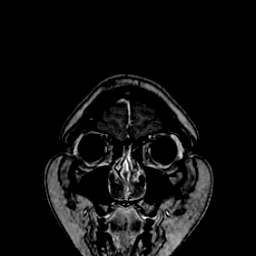
[im 190/205]
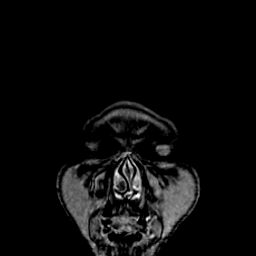
[im 205/205]
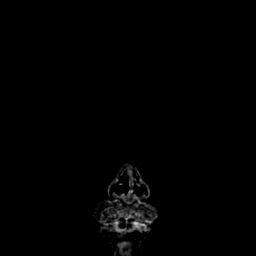

[48 of 48 positions shown; findings below may reference images not displayed]

FINDINGS: MRI brain:

Brain: No acute infarction, hemorrhage, hydrocephalus, extra-axial
collection or mass lesion. The brain parenchyma has normal
morphology and signal characteristics.

Vascular: Normal flow voids.

Skull and upper cervical spine: Normal marrow signal.

Sinuses/Orbits: Negative.

Other: Prominent bilateral palatine tonsils.

MRV brain:

Superior sagittal sinus, internal cerebral veins, vein of Serge Martial,
straight sinus, transverse sinuses, sigmoid sinuses, and jugular
bulbs are patent without evidence of thrombus or stenosis.
IMPRESSION: 1. No acute intracranial abnormality.
2. Normal MRV of the head. No evidence of dural venous sinus
thrombosis.
